# Patient Record
Sex: Male | Born: 1960 | Race: Black or African American | Hispanic: No | Smoking: Never smoker
Health system: Southern US, Community
[De-identification: ages and names within clinical notes are randomized; demographics above are authoritative.]

## PROBLEM LIST (undated history)

## (undated) DIAGNOSIS — M1990 Unspecified osteoarthritis, unspecified site: Secondary | ICD-10-CM

## (undated) DIAGNOSIS — E785 Hyperlipidemia, unspecified: Secondary | ICD-10-CM

## (undated) DIAGNOSIS — I10 Essential (primary) hypertension: Secondary | ICD-10-CM

## (undated) HISTORY — DX: Unspecified osteoarthritis, unspecified site: M19.90

## (undated) HISTORY — PX: OTHER SURGICAL HISTORY: SHX169

## (undated) HISTORY — DX: Essential (primary) hypertension: I10

## (undated) HISTORY — DX: Hyperlipidemia, unspecified: E78.5

---

## 1997-11-13 ENCOUNTER — Ambulatory Visit (HOSPITAL_COMMUNITY): Admission: RE | Admit: 1997-11-13 | Discharge: 1997-11-13 | Payer: Self-pay | Admitting: Family Medicine

## 2006-03-02 ENCOUNTER — Encounter: Admission: RE | Admit: 2006-03-02 | Discharge: 2006-03-02 | Payer: Self-pay | Admitting: Family Medicine

## 2008-12-09 ENCOUNTER — Encounter: Admission: RE | Admit: 2008-12-09 | Discharge: 2008-12-09 | Payer: Self-pay | Admitting: Emergency Medicine

## 2011-05-02 HISTORY — PX: COLONOSCOPY: SHX174

## 2011-08-03 ENCOUNTER — Ambulatory Visit
Admission: RE | Admit: 2011-08-03 | Discharge: 2011-08-03 | Disposition: A | Discharge status: Home / Self Care | Payer: 59 | Source: Ambulatory Visit | Attending: Family Medicine | Admitting: Family Medicine

## 2011-08-03 ENCOUNTER — Other Ambulatory Visit: Payer: Self-pay | Admitting: Family Medicine

## 2011-08-03 DIAGNOSIS — M25569 Pain in unspecified knee: Secondary | ICD-10-CM

## 2011-08-10 ENCOUNTER — Encounter: Payer: Self-pay | Admitting: Gastroenterology

## 2011-08-17 ENCOUNTER — Ambulatory Visit (AMBULATORY_SURGERY_CENTER): Payer: 59 | Admitting: *Deleted

## 2011-08-17 VITALS — Ht 69.0 in | Wt 214.1 lb

## 2011-08-17 DIAGNOSIS — Z1211 Encounter for screening for malignant neoplasm of colon: Secondary | ICD-10-CM

## 2011-08-17 MED ORDER — PEG-KCL-NACL-NASULF-NA ASC-C 100 G PO SOLR: ORAL | Status: DC

## 2011-08-18 ENCOUNTER — Encounter: Payer: Self-pay | Admitting: Gastroenterology

## 2011-08-31 ENCOUNTER — Encounter: Payer: Self-pay | Admitting: Gastroenterology

## 2011-08-31 ENCOUNTER — Ambulatory Visit (AMBULATORY_SURGERY_CENTER): Payer: 59 | Admitting: Gastroenterology

## 2011-08-31 VITALS — BP 163/115 | HR 96 | Temp 97.5°F | Resp 22 | Ht 69.0 in | Wt 214.0 lb

## 2011-08-31 DIAGNOSIS — K5730 Diverticulosis of large intestine without perforation or abscess without bleeding: Secondary | ICD-10-CM

## 2011-08-31 DIAGNOSIS — D126 Benign neoplasm of colon, unspecified: Secondary | ICD-10-CM

## 2011-08-31 DIAGNOSIS — Z1211 Encounter for screening for malignant neoplasm of colon: Secondary | ICD-10-CM

## 2011-08-31 MED ORDER — SODIUM CHLORIDE 0.9 % IV SOLN: 500.0000 mL | INTRAVENOUS | Status: DC

## 2011-08-31 NOTE — Progress Notes (Signed)
Patient did not experience any of the following events: a burn prior to discharge; a fall within the facility; wrong site/side/patient/procedure/implant event; or a hospital transfer or hospital admission upon discharge from the facility. (G8907) Patient did not have preoperative order for IV antibiotic SSI prophylaxis. (G8918)  

## 2011-08-31 NOTE — Op Note (Signed)
[facility] [address] [place], [place]  [address]  COLONOSCOPY PROCEDURE REPORT  PATIENT:  Jesus Campbell, Jesus Campbell  MR#:  [id number] BIRTHDATE:  [birth year], 50 yrs. old  GENDER:  male ENDOSCOPIST:  Barbette Hair. Arlyce Dice, MD REF. BY:  Clyda Greener, M.D. PROCEDURE DATE:  08/31/2011 PROCEDURE:  Colonoscopy with snare polypectomy ASA CLASS:  Class II INDICATIONS:  Routine Risk Screening MEDICATIONS:   MAC sedation, administered by CRNA propofol 300mg IV  DESCRIPTION OF PROCEDURE:   After the risks benefits and alternatives of the procedure were thoroughly explained, informed consent was obtained.  Digital rectal exam was performed and revealed no abnormalities.   The LB CF-[id number] endoscope was introduced through the anus and advanced to the cecum, which was identified by both the appendix and ileocecal valve, without limitations.  The quality of the prep was excellent, using MiraLax.  The instrument was then slowly withdrawn as the colon was fully examined. <<PROCEDUREIMAGES>>  FINDINGS:  A sessile polyp was found in the sigmoid colon. It was 3 mm in size. It was found 20 cm from the point of entry. Polyp was snared without cautery. Retrieval was successful (see image6). snare polyp  Scattered diverticula were found (see image4 and image5). sigmoid to ascending colon  This was otherwise a normal examination of the colon (see image2, image1, and image7). Retroflexed views in the rectum revealed no abnormalities.    The time to cecum =  1) 3.0  minutes. The scope was then withdrawn in 1) 8.25  minutes from the cecum and the procedure completed. COMPLICATIONS:  None ENDOSCOPIC IMPRESSION: 1) 3 mm sessile polyp in the sigmoid colon 2) Diverticula, scattered 3) Otherwise normal examination RECOMMENDATIONS: 1) If the polyp(s) removed today are proven to be adenomatous (pre-cancerous) polyps, you will need a repeat colonoscopy in 5 years. Otherwise you should continue to  follow colorectal cancer screening guidelines for "routine risk" patients with colonoscopy in 10 years. You will receive a letter within 1-2 weeks with the results of your biopsy as well as final recommendations. Please call my office if you have not received a letter after 3 weeks. REPEAT EXAM:  You will receive a letter from Dr. Arlyce Dice in 1-2 weeks, after reviewing the final pathology, with followup recommendations.  ______________________________ Barbette Hair Arlyce Dice, MD  CC:  n. eSIGNED:   Barbette Hair. Asberry Lascola at 08/31/2011 12:07 PM  Madison Hickman, [phone]

## 2011-08-31 NOTE — Patient Instructions (Signed)
YOU HAD AN ENDOSCOPIC PROCEDURE TODAY AT THE [facility]: Refer to the procedure report that was given to you for any specific questions about what was found during the examination.  If the procedure report does not answer your questions, please call your gastroenterologist to clarify.  If you requested that your care partner not be given the details of your procedure findings, then the procedure report has been included in a sealed envelope for you to review at your convenience later.  YOU SHOULD EXPECT: Some feelings of bloating in the abdomen. Passage of more gas than usual.  Walking can help get rid of the air that was put into your GI tract during the procedure and reduce the bloating. If you had a lower endoscopy (such as a colonoscopy or flexible sigmoidoscopy) you may notice spotting of blood in your stool or on the toilet paper. If you underwent a bowel prep for your procedure, then you may not have a normal bowel movement for a few days.  DIET: Your first meal following the procedure should be a light meal and then it is ok to progress to your normal diet.  A half-sandwich or bowl of soup is an example of a good first meal.  Heavy or fried foods are harder to digest and may make you feel nauseous or bloated.  Likewise meals heavy in dairy and vegetables can cause extra gas to form and this can also increase the bloating.  Drink plenty of fluids but you should avoid alcoholic beverages for 24 hours.  ACTIVITY: Your care partner should take you home directly after the procedure.  You should plan to take it easy, moving slowly for the rest of the day.  You can resume normal activity the day after the procedure however you should NOT DRIVE or use heavy machinery for 24 hours (because of the sedation medicines used during the test).    SYMPTOMS TO REPORT IMMEDIATELY: A gastroenterologist can be reached at any hour.  During normal business hours, 8:30 AM to 5:00 PM Monday through Friday,  call [phone].  After hours and on weekends, please call the GI answering service at [phone] who will take a message and have the physician on call contact you.   Following lower endoscopy (colonoscopy or flexible sigmoidoscopy):  Excessive amounts of blood in the stool  Significant tenderness or worsening of abdominal pains  Swelling of the abdomen that is new, acute  Fever of 100F or higher  Following upper endoscopy (EGD)  Vomiting of blood or coffee ground material  New chest pain or pain under the shoulder blades  Painful or persistently difficult swallowing  New shortness of breath  Fever of 100F or higher  Black, tarry-looking stools  FOLLOW UP: If any biopsies were taken you will be contacted by phone or by letter within the next 1-3 weeks.  Call your gastroenterologist if you have not heard about the biopsies in 3 weeks.  Our staff will call the home number listed on your records the next business day following your procedure to check on you and address any questions or concerns that you may have at that time regarding the information given to you following your procedure. This is a courtesy call and so if there is no answer at the home number and we have not heard from you through the emergency physician on call, we will assume that you have returned to your regular daily activities without incident.  SIGNATURES/CONFIDENTIALITY: You and/or your care   partner have signed paperwork which will be entered into your electronic medical record.  These signatures attest to the fact that that the information above on your After Visit Summary has been reviewed and is understood.  Full responsibility of the confidentiality of this discharge information lies with you and/or your care-partner.  

## 2011-09-01 ENCOUNTER — Telehealth: Payer: Self-pay

## 2011-09-01 NOTE — Telephone Encounter (Signed)
  Follow up Call-  Call back number 08/31/2011  Post procedure Call Back phone  # [phone]  Permission to leave phone message Yes     Patient questions:  Do you have a fever, pain , or abdominal swelling? no Pain Score  0 *  Have you tolerated food without any problems? yes  Have you been able to return to your normal activities? yes  Do you have any questions about your discharge instructions: Diet   no Medications  no Follow up visit  no  Do you have questions or concerns about your Care? no  Actions: * If pain score is 4 or above: No action needed, pain <4.

## 2011-09-06 ENCOUNTER — Encounter: Payer: Self-pay | Admitting: Gastroenterology

## 2014-07-12 ENCOUNTER — Emergency Department (HOSPITAL_COMMUNITY)
Admission: EM | Admit: 2014-07-12 | Discharge: 2014-07-12 | Disposition: A | Discharge status: Home / Self Care | Payer: No Typology Code available for payment source | Attending: Emergency Medicine | Admitting: Emergency Medicine

## 2014-07-12 ENCOUNTER — Encounter (HOSPITAL_COMMUNITY): Payer: Self-pay | Admitting: Emergency Medicine

## 2014-07-12 DIAGNOSIS — S161XXA Strain of muscle, fascia and tendon at neck level, initial encounter: Secondary | ICD-10-CM | POA: Diagnosis not present

## 2014-07-12 DIAGNOSIS — E785 Hyperlipidemia, unspecified: Secondary | ICD-10-CM | POA: Insufficient documentation

## 2014-07-12 DIAGNOSIS — Z79899 Other long term (current) drug therapy: Secondary | ICD-10-CM | POA: Insufficient documentation

## 2014-07-12 DIAGNOSIS — Y9389 Activity, other specified: Secondary | ICD-10-CM | POA: Insufficient documentation

## 2014-07-12 DIAGNOSIS — S39012A Strain of muscle, fascia and tendon of lower back, initial encounter: Secondary | ICD-10-CM | POA: Insufficient documentation

## 2014-07-12 DIAGNOSIS — Z791 Long term (current) use of non-steroidal anti-inflammatories (NSAID): Secondary | ICD-10-CM | POA: Insufficient documentation

## 2014-07-12 DIAGNOSIS — Y998 Other external cause status: Secondary | ICD-10-CM | POA: Diagnosis not present

## 2014-07-12 DIAGNOSIS — Y92410 Unspecified street and highway as the place of occurrence of the external cause: Secondary | ICD-10-CM | POA: Insufficient documentation

## 2014-07-12 DIAGNOSIS — S199XXA Unspecified injury of neck, initial encounter: Secondary | ICD-10-CM | POA: Diagnosis present

## 2014-07-12 DIAGNOSIS — I10 Essential (primary) hypertension: Secondary | ICD-10-CM | POA: Diagnosis not present

## 2014-07-12 DIAGNOSIS — M1990 Unspecified osteoarthritis, unspecified site: Secondary | ICD-10-CM | POA: Insufficient documentation

## 2014-07-12 MED ORDER — NAPROXEN 500 MG PO TABS: 500.0000 mg | ORAL_TABLET | Freq: Two times a day (BID) | ORAL | Status: AC

## 2014-07-12 MED ORDER — METHOCARBAMOL 500 MG PO TABS: 500.0000 mg | ORAL_TABLET | Freq: Two times a day (BID) | ORAL | Status: AC | PRN
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
## 2014-07-12 NOTE — Discharge Instructions (Signed)
Please call your doctor for a followup appointment within 24-48 hours. When you talk to your doctor please let them know that you were seen in the emergency department and have them acquire all of your records so that they can discuss the findings with you and formulate a treatment plan to fully care for your new and ongoing problems. ° °
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
## 2014-07-16 ENCOUNTER — Ambulatory Visit
Admission: RE | Admit: 2014-07-16 | Discharge: 2014-07-16 | Disposition: A | Discharge status: Home / Self Care | Payer: No Typology Code available for payment source | Source: Ambulatory Visit | Attending: Family Medicine | Admitting: Family Medicine

## 2014-07-16 ENCOUNTER — Other Ambulatory Visit: Payer: Self-pay | Admitting: Family Medicine

## 2014-07-16 DIAGNOSIS — T1490XA Injury, unspecified, initial encounter: Secondary | ICD-10-CM

## 2014-07-21 ENCOUNTER — Ambulatory Visit: Payer: No Typology Code available for payment source | Attending: Family Medicine | Admitting: Physical Therapy

## 2014-07-21 DIAGNOSIS — R531 Weakness: Secondary | ICD-10-CM | POA: Diagnosis not present

## 2014-07-21 DIAGNOSIS — M542 Cervicalgia: Secondary | ICD-10-CM | POA: Insufficient documentation

## 2014-07-21 DIAGNOSIS — M545 Low back pain, unspecified: Secondary | ICD-10-CM
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
## 2014-07-28 ENCOUNTER — Encounter: Payer: Self-pay | Admitting: Physical Therapy

## 2014-07-28 ENCOUNTER — Ambulatory Visit: Payer: No Typology Code available for payment source | Admitting: Physical Therapy

## 2014-07-28 DIAGNOSIS — R531 Weakness: Secondary | ICD-10-CM

## 2014-07-28 DIAGNOSIS — M545 Low back pain, unspecified: Secondary | ICD-10-CM

## 2014-07-28 DIAGNOSIS — M542 Cervicalgia: Secondary | ICD-10-CM | POA: Diagnosis not present

## 2014-07-28 NOTE — Therapy (Signed)
[address] [place], [place], [address] Phone: [phone]   Fax:  [phone]  Physical Therapy Treatment  Patient Details  Name: Jesus Campbell MRN: [id number] Date of Birth: February 18, 1961 Referring Provider:  Elizabeth Palau, *  Encounter Date: 07/28/2014    Past Medical History  Diagnosis Date  . Hypertension   . Arthritis     knee  . Hyperlipidemia     Past Surgical History  Procedure Laterality Date  . No prior surgeries      There were no vitals filed for this visit.  Visit Diagnosis:  Pain in neck  Midline low back pain without sciatica  Weakness generalized      Subjective Assessment - 07/28/14 1313    Symptoms I'm ok. It's just sore.   Pain Score 5    Pain Location Back   Pain Orientation Lower                       [facility] Adult PT Treatment/Exercise - 07/28/14 0001    Exercises   Exercises Lumbar   Lumbar Exercises: Aerobic   Tread Mill 6 minutes  Nustep level 5   Lumbar Exercises: Standing   Other Standing Lumbar Exercises 10# pull to midline   10 reps bilaterally   Lumbar Exercises: Supine   Bridge 10 reps  2 sets   Straight Leg Raise 10 reps  2 sets   Other Supine Lumbar Exercises KTC, rotation with ball 2x15   Lumbar Exercises: Prone   Other Prone Lumbar Exercises prone on elbows   Modalities   Modalities Electrical Stimulation;Moist Heat   Moist Heat Therapy   Number Minutes Moist Heat 15 Minutes   Moist Heat Location Other (comment)  lumbar   Electrical Stimulation   Electrical Stimulation Location low back   Electrical Stimulation Action IFC   Electrical Stimulation Goals Pain                PT Education - 07/28/14 1354    Education provided Yes   Education Details Posture, lifting, body mechanics   Person(s) Educated Patient   Methods Demonstration;Explanation   Comprehension Verbalized understanding              PT Long Term Goals - 07/28/14 1353    PT LONG TERM GOAL #1   Title independent with HEP (08/18/14)   Time 4   Period Weeks   Status On-going   PT LONG TERM GOAL #2   Title verbalize understanding of posture and body mechanics to reduce risk of reinjury (08/18/14)   Time 4   Period Weeks   Status Achieved   PT LONG TERM GOAL #3   Title perform lumbar and cervical ROM without increase in pain (08/18/14)   Time 4   Period Weeks   Status On-going   PT LONG TERM GOAL #4   Title report ability to work full day with pain < 3/10 (08/18/14)   Time 4   Period Weeks   Status On-going               Problem List There are no active problems to display for this patient.   Kodi Guerrera PTA 07/28/2014, 1:57 PM  [address] [place], [place], [address] Phone: [phone]   Fax:  [phone]

## 2014-07-30 ENCOUNTER — Encounter: Payer: Self-pay | Admitting: Physical Therapy

## 2014-07-30 ENCOUNTER — Ambulatory Visit: Payer: No Typology Code available for payment source | Admitting: Physical Therapy

## 2014-07-30 DIAGNOSIS — M542 Cervicalgia: Secondary | ICD-10-CM | POA: Diagnosis not present

## 2014-07-30 DIAGNOSIS — M545 Low back pain, unspecified: Secondary | ICD-10-CM

## 2014-07-30 DIAGNOSIS — R531 Weakness: Secondary | ICD-10-CM

## 2014-07-30 NOTE — Therapy (Signed)
[address] [place], [place], [address] Phone: [phone]   Fax:  [phone]  Physical Therapy Treatment  Patient Details  Name: Jesus Campbell MRN: [id number] Date of Birth: 11/09/1960 Referring Provider:  Elizabeth Palau, *  Encounter Date: 07/30/2014      PT End of Session - 07/30/14 1340    Visit Number 3   Date for PT Re-Evaluation 08/20/14   PT Start Time 1302   PT Stop Time 1400   PT Time Calculation (min) 58 min   Activity Tolerance Patient tolerated treatment well      Past Medical History  Diagnosis Date  . Hypertension   . Arthritis     knee  . Hyperlipidemia     Past Surgical History  Procedure Laterality Date  . No prior surgeries      There were no vitals filed for this visit.  Visit Diagnosis:  Pain in neck  Midline low back pain without sciatica  Weakness generalized      Subjective Assessment - 07/30/14 1305    Symptoms Still sore but maybe a little better   Patient Stated Goals improve low back pain; bend forward without pain                       [facility] Adult PT Treatment/Exercise - 07/30/14 0001    Lumbar Exercises: Stretches   Passive Hamstring Stretch 3 reps;20 seconds   Single Knee to Chest Stretch Limitations on ball 15 reps   Lower Trunk Rotation Limitations on ball 15 reps   Piriformis Stretch 3 reps;20 seconds   Lumbar Exercises: Aerobic   Tread Mill Nustep Level 6 x 6 minutes   Lumbar Exercises: Machines for Strengthening   Cybex Lumbar Extension 10# 2x15   Cybex Knee Extension 10# 2x15   Cybex Knee Flexion 35# 2x15   Other Lumbar Machine Exercise 10# rotation for obliques   Lumbar Exercises: Seated   Other Seated Lumbar Exercises 25# seated row, 25# lats 2x15 each   Lumbar Exercises: Supine   Bridge 15 reps;2 seconds   Large Ball Abdominal Isometric 10 reps;2 seconds   Modalities   Modalities Electrical Stimulation   Moist Heat Therapy   Number Minutes Moist Heat 15 Minutes   Moist Heat Location Other (comment)   Electrical Stimulation   Electrical Stimulation Location low back   Electrical Stimulation Parameters IFC   Electrical Stimulation Goals Pain                     PT Long Term Goals - 07/28/14 1353    PT LONG TERM GOAL #1   Title independent with HEP (08/18/14)   Time 4   Period Weeks   Status On-going   PT LONG TERM GOAL #2   Title verbalize understanding of posture and body mechanics to reduce risk of reinjury (08/18/14)   Time 4   Period Weeks   Status Achieved   PT LONG TERM GOAL #3   Title perform lumbar and cervical ROM without increase in pain (08/18/14)   Time 4   Period Weeks   Status On-going   PT LONG TERM GOAL #4   Title report ability to work full day with pain < 3/10 (08/18/14)   Time 4   Period Weeks   Status On-going               Plan - 07/30/14 1341  Clinical Impression Statement Doing better, still sore and tender along the lumbar paraspinals   Pt will benefit from skilled therapeutic intervention in order to improve on the following deficits Decreased range of motion;Improper body mechanics;Postural dysfunction;Pain;Decreased strength   Rehab Potential Good   PT Frequency 2x / week   PT Duration 3 weeks   PT Treatment/Interventions ADLs/Self Care Home Management;Cryotherapy;Electrical Stimulation;Functional mobility training;Neuromuscular re-education;Ultrasound;Manual techniques;Passive range of motion;Therapeutic exercise;Moist Heat;Therapeutic activities;Patient/family education   PT Next Visit Plan HEP for stretching, hip/core strengthening, modalities PRN        Problem List There are no active problems to display for this patient.   Sumner Boast, PT 07/30/2014, 1:42 PM  [address] [place], [place], [address] Phone: [phone]   Fax:   [phone]

## 2014-08-04 ENCOUNTER — Encounter: Payer: Self-pay | Admitting: Physical Therapy

## 2014-08-04 ENCOUNTER — Ambulatory Visit: Payer: No Typology Code available for payment source | Attending: Family Medicine | Admitting: Physical Therapy

## 2014-08-04 DIAGNOSIS — M545 Low back pain, unspecified: Secondary | ICD-10-CM

## 2014-08-04 DIAGNOSIS — M542 Cervicalgia: Secondary | ICD-10-CM

## 2014-08-04 DIAGNOSIS — R531 Weakness: Secondary | ICD-10-CM

## 2014-08-04 NOTE — Therapy (Signed)
[place] [address] [place], [place], [address] Phone: [phone]   Fax:  [phone]  Physical Therapy Treatment  Patient Details  Name: Jesus Campbell MRN: [id number] Date of Birth: 1960/07/28 Referring Provider:  Elizabeth Palau, *  Encounter Date: 08/04/2014      PT End of Session - 08/04/14 1404    Visit Number 4   Number of Visits 8   Date for PT Re-Evaluation 08/20/14   PT Start Time 1310   PT Stop Time 1407   PT Time Calculation (min) 57 min   Activity Tolerance Patient tolerated treatment well   Behavior During Therapy [facility] for tasks assessed/performed      Past Medical History  Diagnosis Date  . Hypertension   . Arthritis     knee  . Hyperlipidemia     Past Surgical History  Procedure Laterality Date  . No prior surgeries      There were no vitals filed for this visit.  Visit Diagnosis:  Pain in neck  Midline low back pain without sciatica  Weakness generalized      Subjective Assessment - 08/04/14 1305    Subjective Mostly in my low back.   Currently in Pain? Yes   Pain Score 3                        [facility] Adult PT Treatment/Exercise - 08/04/14 0001    Exercises   Exercises Lumbar   Lumbar Exercises: Stretches   Press Ups Other (comment)  lumbar ext at counter 2x10   Lumbar Exercises: Aerobic   Tread Mill Nustep Level 6 x 6 minutes   Lumbar Exercises: Machines for Strengthening   Leg Press 60# 2x15   Lumbar Exercises: Standing   Wall Slides 15 reps  2 sets with ball   Other Standing Lumbar Exercises hip ext green theraband 2x15   Other Standing Lumbar Exercises 10# pull to midline 2x10   Lumbar Exercises: Prone   Other Prone Lumbar Exercises prone on elbows   Modalities   Modalities Electrical Stimulation;Moist Heat   Moist Heat Therapy   Number Minutes Moist Heat 15 Minutes   Moist Heat Location Other (comment)  lumbar   Electrical  Stimulation   Electrical Stimulation Location low back   Electrical Stimulation Parameters IFC   Electrical Stimulation Goals Pain                PT Education - 08/04/14 1402    Education provided Yes   Education Details Educated on lifting from floor, towel roll for postural aid.   Person(s) Educated Patient   Methods Explanation;Demonstration   Comprehension Verbalized understanding;Returned demonstration             PT Long Term Goals - 07/28/14 1353    PT LONG TERM GOAL #1   Title independent with HEP (08/18/14)   Time 4   Period Weeks   Status On-going   PT LONG TERM GOAL #2   Title verbalize understanding of posture and body mechanics to reduce risk of reinjury (08/18/14)   Time 4   Period Weeks   Status Achieved   PT LONG TERM GOAL #3   Title perform lumbar and cervical ROM without increase in pain (08/18/14)   Time 4   Period Weeks   Status On-going   PT LONG TERM GOAL #4   Title report ability to work full day with pain < 3/10 (  08/18/14)   Time 4   Period Weeks   Status On-going               Problem List There are no active problems to display for this patient.   Anum Palecek PTA 08/04/2014, 2:05 PM  [place], [place], [address] Phone: [phone]   Fax:  [phone]

## 2014-08-06 ENCOUNTER — Ambulatory Visit: Payer: No Typology Code available for payment source | Admitting: Physical Therapy

## 2014-08-06 ENCOUNTER — Encounter: Payer: Self-pay | Admitting: Physical Therapy

## 2014-08-06 DIAGNOSIS — M545 Low back pain, unspecified: Secondary | ICD-10-CM

## 2014-08-06 DIAGNOSIS — M542 Cervicalgia: Secondary | ICD-10-CM | POA: Diagnosis not present

## 2014-08-06 NOTE — Therapy (Signed)
[place], [place], [address] Phone: [phone]   Fax:  [phone]  Physical Therapy Treatment  Patient Details  Name: Jesus Campbell MRN: [id number] Date of Birth: Jan 26, 1961 Referring Provider:  Elizabeth Palau, *  Encounter Date: 08/06/2014      PT End of Session - 08/06/14 1318    Visit Number 5   Number of Visits 8   PT Start Time 7564   PT Stop Time 1330   PT Time Calculation (min) 35 min      Past Medical History  Diagnosis Date  . Hypertension   . Arthritis     knee  . Hyperlipidemia     Past Surgical History  Procedure Laterality Date  . No prior surgeries      There were no vitals filed for this visit.  Visit Diagnosis:  Midline low back pain without sciatica      Subjective Assessment - 08/06/14 1257    Subjective feeling alot better, using BM I was shown last time. 75% better   Currently in Pain? Yes   Pain Score 2    Pain Location Back            [facility] PT Assessment - 08/06/14 0001    AROM   AROM Assessment Site Cervical;Lumbar  WFLs   Strength   Strength Assessment Site Shoulder  ROM and MMT WFLS                   [facility] Adult PT Treatment/Exercise - 08/06/14 0001    Lumbar Exercises: Aerobic   Tread Mill Nustep Level 6 x 6 minutes   Lumbar Exercises: Machines for Strengthening   Cybex Lumbar Extension 20# 2x15   Cybex Knee Extension 15# 2 sets 15   Cybex Knee Flexion 35# 2x15   Leg Press 60# 2x15   Other Lumbar Machine Exercise 15# rotation for obliques   Lumbar Exercises: Standing   Wall Slides 15 reps  2 sets with ball   Other Standing Lumbar Exercises hip ext and abd green theraband 15   Other Standing Lumbar Exercises wall angels 15 times 3 #   Lumbar Exercises: Seated   Other Seated Lumbar Exercises 25# seated row, 25# lats 2x15 each                     PT Long Term Goals - 08/06/14 1315    PT LONG  TERM GOAL #1   Title independent with HEP (08/18/14)   Status On-going   PT LONG TERM GOAL #2   Title verbalize understanding of posture and body mechanics to reduce risk of reinjury (08/18/14)   PT LONG TERM GOAL #3   Title perform lumbar and cervical ROM without increase in pain (08/18/14)   Status Achieved   PT LONG TERM GOAL #4   Title report ability to work full day with pain < 3/10 (08/18/14)   Status On-going               Plan - 08/06/14 1321    Clinical Impression Statement no increased pain with todays treatment, feeling good so no modalities. Pt states working out some at [employer].   PT Next Visit Plan assess gym safe return, 2 visits next week the D/C if no issues        Problem List There are no active problems to display for this patient.   PAYSEUR,[name] PTA 08/06/2014, 1:23 PM  [place] [address] [place], [place], [address] Phone: [phone]   Fax:  [phone]

## 2014-08-11 ENCOUNTER — Encounter: Payer: Self-pay | Admitting: Physical Therapy

## 2014-08-11 ENCOUNTER — Ambulatory Visit: Payer: No Typology Code available for payment source | Admitting: Physical Therapy

## 2014-08-11 DIAGNOSIS — M542 Cervicalgia: Secondary | ICD-10-CM | POA: Diagnosis not present

## 2014-08-11 DIAGNOSIS — M545 Low back pain, unspecified: Secondary | ICD-10-CM

## 2014-08-11 NOTE — Therapy (Signed)
[place] [address] [place], [place], [address] Phone: [phone]   Fax:  [phone]  Physical Therapy Treatment  Patient Details  Name: Jesus Campbell MRN: [id number] Date of Birth: 1960-10-26 Referring Provider:  Elizabeth Palau, *  Encounter Date: 08/11/2014      PT End of Session - 08/11/14 1301    Visit Number 6   Number of Visits 8   PT Start Time 6962   PT Stop Time 1310   PT Time Calculation (min) 15 min      Past Medical History  Diagnosis Date  . Hypertension   . Arthritis     knee  . Hyperlipidemia     Past Surgical History  Procedure Laterality Date  . No prior surgeries      There were no vitals filed for this visit.  Visit Diagnosis:  Midline low back pain without sciatica      Subjective Assessment - 08/11/14 1300    Subjective feeling great, no issues, back at gym   Currently in Pain? No/denies                               PT Education - 08/11/14 1301    Education provided Yes   Education Details gym safety and progression, BM review fo work   [employer]) Educated Patient   Methods Explanation;Demonstration   Comprehension Verbalized understanding;Returned demonstration             PT Long Term Goals - 08/11/14 1302    PT LONG TERM GOAL #1   Title independent with HEP (08/18/14)   Status Achieved   PT LONG TERM GOAL #2   Title verbalize understanding of posture and body mechanics to reduce risk of reinjury (08/18/14)   Status Achieved   PT LONG TERM GOAL #3   Title perform lumbar and cervical ROM without increase in pain (08/18/14)   Status Achieved   PT LONG TERM GOAL #4   Title report ability to work full day with pain < 3/10 (08/18/14)   Status Achieved               Plan - 08/11/14 1302    Clinical Impression Statement pt with good understanding for gym progression and BM   PT Next Visit Plan D/C today         Problem List There are no active problems to display for this patient.   [name],[name] PTA Lum Babe PT 08/11/2014, 1:03 PM  [place] [address] [place], [place], [address] Phone: [phone]   Fax:  [phone]

## 2014-08-13 ENCOUNTER — Ambulatory Visit: Payer: No Typology Code available for payment source | Admitting: Physical Therapy
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
## 2021-10-24 ENCOUNTER — Encounter: Payer: Self-pay | Admitting: Gastroenterology

## 2023-07-24 ENCOUNTER — Telehealth: Payer: Self-pay

## 2023-07-24 ENCOUNTER — Ambulatory Visit (AMBULATORY_SURGERY_CENTER): Payer: Self-pay

## 2023-07-24 VITALS — Ht 70.0 in | Wt 227.8 lb

## 2023-07-24 DIAGNOSIS — Z1211 Encounter for screening for malignant neoplasm of colon: Secondary | ICD-10-CM

## 2023-07-24 NOTE — Progress Notes (Signed)
 No egg or soy allergy known to patient  No issues known to pt with past sedation with any surgeries or procedures Patient denies ever being told they had issues or difficulty with intubation  No FH of Malignant Hyperthermia Pt is not on diet pills Pt is not on  home 02  Pt is not on blood thinners  Pt denies issues with constipation  No A fib or A flutter Have any cardiac testing pending-- no  LOA:  independent  Prep: Spilt dose miralax   Patient's chart reviewed by Cathlyn Parsons CNRA prior to previsit and patient appropriate for the [facility].  Previsit completed and red dot placed by patient's name on their procedure day (on provider's schedule).     PV completed with patient. Prep instructions given to pt at [facility] apt.

## 2023-07-24 NOTE — Telephone Encounter (Signed)
 PV in progress

## 2023-08-20 ENCOUNTER — Ambulatory Visit (AMBULATORY_SURGERY_CENTER): Payer: Self-pay | Admitting: Gastroenterology

## 2023-08-20 ENCOUNTER — Encounter: Payer: Self-pay | Admitting: Gastroenterology

## 2023-08-20 VITALS — BP 116/74 | HR 49 | Temp 98.2°F | Resp 14 | Ht 69.0 in | Wt 227.8 lb

## 2023-08-20 DIAGNOSIS — D128 Benign neoplasm of rectum: Secondary | ICD-10-CM

## 2023-08-20 DIAGNOSIS — D125 Benign neoplasm of sigmoid colon: Secondary | ICD-10-CM

## 2023-08-20 DIAGNOSIS — Z1211 Encounter for screening for malignant neoplasm of colon: Secondary | ICD-10-CM

## 2023-08-20 DIAGNOSIS — K5730 Diverticulosis of large intestine without perforation or abscess without bleeding: Secondary | ICD-10-CM | POA: Diagnosis not present

## 2023-08-20 DIAGNOSIS — D123 Benign neoplasm of transverse colon: Secondary | ICD-10-CM | POA: Diagnosis not present

## 2023-08-20 DIAGNOSIS — K635 Polyp of colon: Secondary | ICD-10-CM

## 2023-08-20 MED ORDER — SODIUM CHLORIDE 0.9 % IV SOLN: 500.0000 mL | Freq: Once | INTRAVENOUS | Status: DC

## 2023-08-20 NOTE — Op Note (Addendum)
 [facility] Patient Name: Jesus Campbell Procedure Date: 08/20/2023 7:57 AM MRN: [id number] Endoscopist: Geralyn Knee E. Cherryl Corona , MD, [id number] Age: 63 Referring MD:  Date of Birth: March 03, 1961 Gender: Male Account #: [id number] Procedure:                Colonoscopy Indications:              Screening for colorectal malignant neoplasm (last                            colonoscopy was more than 10 years ago) Medicines:                Monitored Anesthesia Care Procedure:                Pre-Anesthesia Assessment:                           - Prior to the procedure, a History and Physical                            was performed, and patient medications and                            allergies were reviewed. The patient's tolerance of                            previous anesthesia was also reviewed. The risks                            and benefits of the procedure and the sedation                            options and risks were discussed with the patient.                            All questions were answered, and informed consent                            was obtained. Prior Anticoagulants: The patient has                            taken no anticoagulant or antiplatelet agents. ASA                            Grade Assessment: III - A patient with severe                            systemic disease. After reviewing the risks and                            benefits, the patient was deemed in satisfactory                            condition to undergo the procedure.  After obtaining informed consent, the colonoscope                            was passed under direct vision. Throughout the                            procedure, the patient's blood pressure, pulse, and                            oxygen saturations were monitored continuously. The                            CF [id number] #[id number] was introduced through the anus                            and  advanced to the the cecum, identified by                            appendiceal orifice and ileocecal valve. The                            colonoscopy was performed without difficulty. The                            patient tolerated the procedure well. The quality                            of the bowel preparation was adequate. The                            ileocecal valve, appendiceal orifice, and rectum                            were photographed. The bowel preparation used was                            Miralax via split dose instruction. Scope In: 8:01:30 AM Scope Out: 8:21:08 AM Scope Withdrawal Time: 0 hours 12 minutes 41 seconds  Total Procedure Duration: 0 hours 19 minutes 38 seconds  Findings:                 The perianal and digital rectal examinations were                            normal. Pertinent negatives include normal                            sphincter tone and no palpable rectal lesions.                           Two sessile polyps were found in the transverse                            colon. The polyps were 2 to 5 mm in size.  These                            polyps were removed with a cold snare. Resection                            and retrieval were complete. Estimated blood loss                            was minimal.                           Multiple sessile polyps were found in the rectum                            and sigmoid colon. The polyps were diminutive in                            size. One of these polyps were removed with a cold                            snare. Resection and retrieval were complete.                            Estimated blood loss was minimal.                           Many large-mouthed and small-mouthed diverticula                            were found in the sigmoid colon, descending colon                            and distal transverse colon.                           The exam was otherwise normal throughout the                             examined colon.                           The retroflexed view of the distal rectum and anal                            verge was normal and showed no anal or rectal                            abnormalities. Complications:            No immediate complications. Estimated Blood Loss:     Estimated blood loss was minimal. Impression:               - Two 2 to 5 mm polyps in the transverse colon,  removed with a cold snare. Resected and retrieved.                           - Multiple diminutive polyps in the rectum and in                            the sigmoid colon, removed with a cold snare.                            Resected and retrieved. These appeared consistent                            with hyperplastic polyps.                           - Moderate diverticulosis in the sigmoid colon, in                            the descending colon and in the distal transverse                            colon.                           - The distal rectum and anal verge are normal on                            retroflexion view. Recommendation:           - Patient has a contact number available for                            emergencies. The signs and symptoms of potential                            delayed complications were discussed with the                            patient. Return to normal activities tomorrow.                            Written discharge instructions were provided to the                            patient.                           - Resume previous diet.                           - Continue present medications.                           - Await pathology results.                           -  Repeat colonoscopy (date not yet determined) for                            surveillance based on pathology results.                           - Recommend high fiber diet/daily fiber supplement                            to reduce risk of  diverticular complications. Jaylea Plourde E. Cherryl Corona, MD 08/20/2023 8:31:30 AM This report has been signed electronically.

## 2023-08-20 NOTE — Progress Notes (Signed)
 Pt A/O x 3, gd SR's, pleased with anesthesia, report to RN

## 2023-08-20 NOTE — Progress Notes (Signed)
 [facility] History and Physical   Primary Care Physician:  Dawayne Estrin, PA-C   Reason for Procedure:   Colon cancer screening  Plan:    Screening colonoscopy     HPI: Jesus Campbell is a 63 y.o. male undergoing average risk screening colonoscopy.  He has no family history of colon cancer and no chronic GI symptoms.  He had a colonoscopy in 2013 in which a benign sigmoid polyp was removed (lymphoid aggregate).    Past Medical History:  Diagnosis Date   Arthritis    knee   Hyperlipidemia    Hypertension     Past Surgical History:  Procedure Laterality Date   COLONOSCOPY  2013   Dr. Arvie Latus   no prior surgeries      Prior to Admission medications   Medication Sig Start Date End Date Taking? Authorizing Provider  amLODipine (NORVASC) 10 MG tablet Take 10 mg by mouth Campbell.   Yes [provider]  aspirin EC 81 MG tablet Take 81 mg by mouth Campbell.   Yes [provider]  atorvastatin (LIPITOR) 10 MG tablet Take 1 tablet by mouth at bedtime. 04/20/21  Yes [provider]  carvedilol (COREG) 25 MG tablet Take 1 tablet by mouth 2 (two) times Campbell with a meal. 05/17/23  Yes [provider]  furosemide (LASIX) 20 MG tablet Take 20 mg by mouth Campbell.   Yes [provider]  hydrALAZINE (APRESOLINE) 25 MG tablet Take 2 tablets by mouth 2 (two) times Campbell. 04/19/21  Yes [provider]  lisinopril (PRINIVIL,ZESTRIL) 10 MG tablet Take 10 mg by mouth Campbell.   Yes [provider]  losartan (COZAAR) 100 MG tablet Take 1 tablet by mouth Campbell. 04/14/21  Yes [provider]  simvastatin (ZOCOR) 20 MG tablet Take 20 mg by mouth Campbell.   Yes [provider]  spironolactone (ALDACTONE) 25 MG tablet Take 1 tablet by mouth Campbell. 02/09/23  Yes [provider]  meloxicam (MOBIC) 15 MG tablet Take 15 mg by mouth Campbell. Patient not taking: Reported on 08/20/2023    [provider]   methocarbamol  (ROBAXIN ) 500 MG tablet Take 1 tablet (500 mg total) by mouth 2 (two) times Campbell as needed for muscle spasms. Patient not taking: Reported on 07/21/2014 07/12/14   Early Glisson, MD  Multiple Vitamin (THERA) TABS Take 1 tablet by mouth every morning.    [provider]  naproxen  (NAPROSYN ) 500 MG tablet Take 1 tablet (500 mg total) by mouth 2 (two) times Campbell with a meal. Patient not taking: Reported on 08/20/2023 07/12/14   Early Glisson, MD  Omega-3 Fatty Acids (FISH OIL) 1000 MG CAPS Take by mouth.    [provider]    Current Outpatient Medications  Medication Sig Dispense Refill   amLODipine (NORVASC) 10 MG tablet Take 10 mg by mouth Campbell.     aspirin EC 81 MG tablet Take 81 mg by mouth Campbell.     atorvastatin (LIPITOR) 10 MG tablet Take 1 tablet by mouth at bedtime.     carvedilol (COREG) 25 MG tablet Take 1 tablet by mouth 2 (two) times Campbell with a meal.     furosemide (LASIX) 20 MG tablet Take 20 mg by mouth Campbell.     hydrALAZINE (APRESOLINE) 25 MG tablet Take 2 tablets by mouth 2 (two) times Campbell.     lisinopril (PRINIVIL,ZESTRIL) 10 MG tablet Take 10 mg by mouth Campbell.     losartan (COZAAR) 100 MG tablet  Take 1 tablet by mouth Campbell.     simvastatin (ZOCOR) 20 MG tablet Take 20 mg by mouth Campbell.     spironolactone (ALDACTONE) 25 MG tablet Take 1 tablet by mouth Campbell.     meloxicam (MOBIC) 15 MG tablet Take 15 mg by mouth Campbell. (Patient not taking: Reported on 08/20/2023)     methocarbamol  (ROBAXIN ) 500 MG tablet Take 1 tablet (500 mg total) by mouth 2 (two) times Campbell as needed for muscle spasms. (Patient not taking: Reported on 07/21/2014) 20 tablet 0   Multiple Vitamin (THERA) TABS Take 1 tablet by mouth every morning.     naproxen  (NAPROSYN ) 500 MG tablet Take 1 tablet (500 mg total) by mouth 2 (two) times Campbell with a meal. (Patient not taking: Reported on 08/20/2023) 30 tablet 0   Omega-3 Fatty Acids (FISH OIL) 1000 MG CAPS Take by mouth.      Current Facility-Administered Medications  Medication Dose Route Frequency Provider Last Rate Last Admin   0.9 %  sodium chloride  infusion  500 mL Intravenous Once Elois Hair, MD        Allergies as of 08/20/2023   (No Known Allergies)    Family History  Problem Relation Age of Onset   Colon cancer Neg Hx    Stomach cancer Neg Hx    Rectal cancer Neg Hx     Social History   Socioeconomic History   Marital status: Divorced    Spouse name: Not on file   Number of children: Not on file   Years of education: Not on file   Highest education level: Not on file  Occupational History   Not on file  Tobacco Use   Smoking status: Never   Smokeless tobacco: Never  Vaping Use   Vaping status: Never Used  Substance and Sexual Activity   Alcohol use: No   Drug use: No   Sexual activity: Not on file  Other Topics Concern   Not on file  Social History Narrative   Not on file   Social Drivers of Health   Financial Resource Strain: Low Risk  (06/25/2023)   Received [facility]   Overall Financial Resource Strain (CARDIA)    Difficulty of Paying Living Expenses: Not very hard  Food Insecurity: No Food Insecurity (06/25/2023)   Received from [facility]   Hunger Vital Sign    Worried About Running Out of Food in the Last Year: Never true    Ran Out of Food in the Last Year: Never true  Transportation Needs: No Transportation Needs (06/25/2023)   Received from [facility] - Transportation    Lack of Transportation (Medical): No    Lack of Transportation (Non-Medical): No  Physical Activity: Insufficiently Active (06/25/2023)   Received from [facility]   Exercise Vital Sign    Days of Exercise per Week: 1 day    Minutes of Exercise per Session: 30 min  Stress: No Stress Concern Present (06/25/2023)   Received from [facility] - Occupational Stress Questionnaire    Feeling of Stress : Not at all   Social Connections: Moderately Integrated (06/25/2023)   Received from [facility]   Social Network    How would you rate your social network (family, work, friends)?: Adequate participation with social networks  Intimate Partner Violence: Not At Risk (06/25/2023)   Received from [facility]   HITS    Over the last 12 months  how often did your partner physically hurt you?: Never    Over the last 12 months how often did your partner insult you or talk down to you?: Never    Over the last 12 months how often did your partner threaten you with physical harm?: Never    Over the last 12 months how often did your partner scream or curse at you?: Never    Review of Systems:  All other review of systems negative except as mentioned in the HPI.  Physical Exam: Vital signs BP (!) 145/98   Pulse (!) 58   Temp 98.2 F (36.8 C) (Skin)   Ht 5\' 9"  (1.753 m)   Wt 227 lb 12.8 oz (103.3 kg)   SpO2 97%   BMI 33.64 kg/m   General:   Alert,  Well-developed, well-nourished, pleasant and cooperative in NAD Airway:  Mallampati 3 Lungs:  Clear throughout to auscultation.   Heart:  Regular rate and rhythm; no murmurs, clicks, rubs,  or gallops. Abdomen:  Soft, nontender and nondistended. Normal bowel sounds.   Neuro/Psych:  Normal mood and affect. A and O x 3   Taiwan Talcott E. Cherryl Corona, MD [facility]

## 2023-08-20 NOTE — Progress Notes (Signed)
 Pt's states no medical or surgical changes since previsit or office visit.

## 2023-08-20 NOTE — Patient Instructions (Signed)
-  Handout on polyps, high fiber diet, diverticulosis  provided -await pathology results -repeat colonoscopy for surveillance recommended. Date to be determined when pathology result become available   -Continue present medications   YOU HAD AN ENDOSCOPIC PROCEDURE TODAY AT THE [facility]:   Refer to the procedure report that was given to you for any specific questions about what was found during the examination.  If the procedure report does not answer your questions, please call your gastroenterologist to clarify.  If you requested that your care partner not be given the details of your procedure findings, then the procedure report has been included in a sealed envelope for you to review at your convenience later.  YOU SHOULD EXPECT: Some feelings of bloating in the abdomen. Passage of more gas than usual.  Walking can help get rid of the air that was put into your GI tract during the procedure and reduce the bloating. If you had a lower endoscopy (such as a colonoscopy or flexible sigmoidoscopy) you may notice spotting of blood in your stool or on the toilet paper. If you underwent a bowel prep for your procedure, you may not have a normal bowel movement for a few days.  Please Note:  You might notice some irritation and congestion in your nose or some drainage.  This is from the oxygen used during your procedure.  There is no need for concern and it should clear up in a day or so.  SYMPTOMS TO REPORT IMMEDIATELY:  Following lower endoscopy (colonoscopy or flexible sigmoidoscopy):  Excessive amounts of blood in the stool  Significant tenderness or worsening of abdominal pains  Swelling of the abdomen that is new, acute  Fever of 100F or higher   For urgent or emergent issues, a gastroenterologist can be reached at any hour by calling [phone]. Do not use MyChart messaging for urgent concerns.    DIET:  We do recommend a small meal at first, but then you may proceed to  your regular diet.  Drink plenty of fluids but you should avoid alcoholic beverages for 24 hours.  ACTIVITY:  You should plan to take it easy for the rest of today and you should NOT DRIVE or use heavy machinery until tomorrow (because of the sedation medicines used during the test).    FOLLOW UP: Our staff will call the number listed on your records the next business day following your procedure.  We will call around 7:15- 8:00 am to check on you and address any questions or concerns that you may have regarding the information given to you following your procedure. If we do not reach you, we will leave a message.     If any biopsies were taken you will be contacted by phone or by letter within the next 1-3 weeks.  Please call us  at [phone] if you have not heard about the biopsies in 3 weeks.    SIGNATURES/CONFIDENTIALITY: You and/or your care partner have signed paperwork which will be entered into your electronic medical record.  These signatures attest to the fact that that the information above on your After Visit Summary has been reviewed and is understood.  Full responsibility of the confidentiality of this discharge information lies with you and/or your care-partner.

## 2023-08-21 ENCOUNTER — Telehealth: Payer: Self-pay

## 2023-08-21 NOTE — Telephone Encounter (Signed)
 No answer, left message to call if having any issues or concerns, B.Vale Mousseau RN

## 2023-08-22 LAB — SURGICAL PATHOLOGY

## 2023-08-23 ENCOUNTER — Encounter: Payer: Self-pay | Admitting: Gastroenterology
